# Patient Record
Sex: Male | Born: 1980
Health system: Southern US, Community
[De-identification: ages and names within clinical notes are randomized; demographics above are authoritative.]

## PROBLEM LIST (undated history)

## (undated) DIAGNOSIS — F419 Anxiety disorder, unspecified: Secondary | ICD-10-CM

## (undated) HISTORY — DX: Anxiety disorder, unspecified: F41.9

---

## 2001-07-09 HISTORY — PX: TONSILLECTOMY: SUR1361

## 2015-05-12 ENCOUNTER — Other Ambulatory Visit: Payer: Self-pay | Admitting: *Deleted

## 2015-05-12 ENCOUNTER — Ambulatory Visit
Admission: RE | Admit: 2015-05-12 | Discharge: 2015-05-12 | Disposition: A | Discharge status: Home / Self Care | Payer: No Typology Code available for payment source | Source: Ambulatory Visit | Attending: *Deleted | Admitting: *Deleted

## 2015-05-12 ENCOUNTER — Encounter (INDEPENDENT_AMBULATORY_CARE_PROVIDER_SITE_OTHER): Payer: Self-pay

## 2015-05-12 DIAGNOSIS — R7611 Nonspecific reaction to tuberculin skin test without active tuberculosis: Secondary | ICD-10-CM

## 2016-04-04 ENCOUNTER — Other Ambulatory Visit: Payer: Self-pay | Admitting: *Deleted

## 2016-04-04 ENCOUNTER — Ambulatory Visit
Admission: RE | Admit: 2016-04-04 | Discharge: 2016-04-04 | Disposition: A | Discharge status: Home / Self Care | Payer: No Typology Code available for payment source | Source: Ambulatory Visit | Attending: *Deleted | Admitting: *Deleted

## 2016-04-04 DIAGNOSIS — R7611 Nonspecific reaction to tuberculin skin test without active tuberculosis: Secondary | ICD-10-CM

## 2016-06-28 ENCOUNTER — Ambulatory Visit (INDEPENDENT_AMBULATORY_CARE_PROVIDER_SITE_OTHER): Payer: BLUE CROSS/BLUE SHIELD | Admitting: Physician Assistant

## 2016-06-28 VITALS — BP 118/80 | HR 89 | Temp 97.3°F | Resp 20 | Ht 72.5 in | Wt 220.8 lb

## 2016-06-28 DIAGNOSIS — J988 Other specified respiratory disorders: Secondary | ICD-10-CM

## 2016-06-28 MED ORDER — FLUTICASONE PROPIONATE 50 MCG/ACT NA SUSP: 2.0000 | Freq: Every day | NASAL | 12 refills | Status: DC

## 2016-06-28 MED ORDER — AMOXICILLIN-POT CLAVULANATE 875-125 MG PO TABS: 1.0000 | ORAL_TABLET | Freq: Two times a day (BID) | ORAL | 0 refills | Status: AC

## 2016-06-28 MED ORDER — BENZONATATE 100 MG PO CAPS: 100.0000 mg | ORAL_CAPSULE | Freq: Three times a day (TID) | ORAL | 0 refills | Status: DC | PRN

## 2016-06-28 NOTE — Progress Notes (Signed)
[facility] [address], [place] [address] [phone]  Date:  06/28/2016   Name:  Jeff Woods   DOB:  [birth year]   MRN:  [id number]  PCP:  No PCP Per Patient    History of Present Illness:  Jeff Woods is a 35 y.o. male patient who [facility] to [facility] for cc of nasal congestion and cough. --sinus pressure for --A few days. Malaise and fatigue present. --Nasal congestion and cough. The cough appes productive though only more prominent in the morning. --No shortness of breath or trouble breathing.   There are no active problems to display for this patient.   Past Medical History:  Diagnosis Date  . Anxiety     No past surgical history on file.  Social History  Substance Use Topics  . Smoking status: Current Every Day Smoker  . Smokeless tobacco: Not on file  . Alcohol use Not on file    No family history on file.  No Known Allergies  Medication list has been reviewed and updated.  No current outpatient prescriptions on file prior to visit.   No current facility-administered medications on file prior to visit.     ROS ROS otherwise unremarkable unless listed above.  Physical Examination: BP 118/80   Pulse 89   Temp 97.3 F (36.3 C) (Oral)   Resp 20   Ht 6' 0.5" (1.842 m)   Wt 220 lb 12.8 oz (100.2 kg)   SpO2 96%   BMI 29.53 kg/m  Ideal Body Weight: Weight in (lb) to have BMI = 25: 186.5  Physical Exam  Constitutional: He is oriented to person, place, and time. He appears well-developed and well-nourished. No distress.  HENT:  Head: Atraumatic.  Right Ear: Tympanic membrane, external ear and ear canal normal.  Left Ear: Tympanic membrane, external ear and ear canal normal.  Nose: Mucosal edema and rhinorrhea present. Right sinus exhibits no maxillary sinus tenderness and no frontal sinus tenderness. Left sinus exhibits no maxillary sinus tenderness and no frontal sinus tenderness.  Mouth/Throat: No uvula swelling. No  oropharyngeal exudate, posterior oropharyngeal edema or posterior oropharyngeal erythema.  Eyes: Conjunctivae, EOM and lids are normal. Pupils are equal, round, and reactive to light. Right eye exhibits normal extraocular motion. Left eye exhibits normal extraocular motion.  Neck: Trachea normal and full passive range of motion without pain. No edema and no erythema present.  Cardiovascular: Normal rate.   Pulmonary/Chest: Effort normal. No respiratory distress. He has no decreased breath sounds. He has no wheezes. He has no rhonchi.  Neurological: He is alert and oriented to person, place, and time.  Skin: Skin is warm and dry. He is not diaphoretic.  Psychiatric: He has a normal mood and affect. His behavior is normal.     Assessment and Plan: Jeff Woods is a 35 y.o. male who is here today for nasal congestion and cough. Will cover for bacterial etiology. Respiratory infection - Plan: benzonatate (TESSALON) 100 MG capsule, amoxicillin-clavulanate (AUGMENTIN) 875-125 MG tablet, fluticasone (FLONASE) 50 MCG/ACT nasal spray   Trena PlattStephanie Llesenia Fogal, PA-C [facility] 06/28/2016 2:44 PM

## 2016-06-28 NOTE — Patient Instructions (Addendum)
   Please hydrate well with water, 64 oz or more You can use the delsym over the counter for daytime cough.  You can also use the tessalon pearls that were prescribed. Take antibiotic as prescribed.   IF you received an x-ray today, you will receive an invoice from [facility]. Please contact [facility] at [phone] with questions or concerns regarding your invoice.   IF you received labwork today, you will receive an invoice from [facility]. Please contact [facility] at [phone] with questions or concerns regarding your invoice.   Our billing staff will not be able to assist you with questions regarding bills from these companies.  You will be contacted with the lab results as soon as they are available. The fastest way to get your results is to activate your My Chart account. Instructions are located on the last page of this paperwork. If you have not heard from us regarding the results in 2 weeks, please contact this office.

## 2017-04-10 ENCOUNTER — Ambulatory Visit: Payer: Self-pay | Admitting: Internal Medicine

## 2017-05-10 ENCOUNTER — Ambulatory Visit: Payer: Self-pay | Admitting: Internal Medicine

## 2018-04-18 ENCOUNTER — Encounter (INDEPENDENT_AMBULATORY_CARE_PROVIDER_SITE_OTHER): Payer: Self-pay

## 2018-04-18 ENCOUNTER — Encounter: Payer: Self-pay | Admitting: Family

## 2018-04-18 ENCOUNTER — Other Ambulatory Visit (INDEPENDENT_AMBULATORY_CARE_PROVIDER_SITE_OTHER): Payer: BLUE CROSS/BLUE SHIELD

## 2018-04-18 ENCOUNTER — Other Ambulatory Visit: Payer: BLUE CROSS/BLUE SHIELD

## 2018-04-18 ENCOUNTER — Ambulatory Visit (INDEPENDENT_AMBULATORY_CARE_PROVIDER_SITE_OTHER): Payer: BLUE CROSS/BLUE SHIELD | Admitting: Family

## 2018-04-18 VITALS — BP 106/84 | HR 91 | Temp 98.1°F | Ht 72.5 in | Wt 213.0 lb

## 2018-04-18 DIAGNOSIS — Z13220 Encounter for screening for lipoid disorders: Secondary | ICD-10-CM | POA: Diagnosis not present

## 2018-04-18 DIAGNOSIS — J342 Deviated nasal septum: Secondary | ICD-10-CM

## 2018-04-18 DIAGNOSIS — F1021 Alcohol dependence, in remission: Secondary | ICD-10-CM

## 2018-04-18 DIAGNOSIS — F419 Anxiety disorder, unspecified: Secondary | ICD-10-CM | POA: Diagnosis not present

## 2018-04-18 DIAGNOSIS — F1921 Other psychoactive substance dependence, in remission: Secondary | ICD-10-CM | POA: Insufficient documentation

## 2018-04-18 DIAGNOSIS — R5383 Other fatigue: Secondary | ICD-10-CM

## 2018-04-18 DIAGNOSIS — Z0000 Encounter for general adult medical examination without abnormal findings: Secondary | ICD-10-CM

## 2018-04-18 LAB — COMPREHENSIVE METABOLIC PANEL
ALBUMIN: 4.7 g/dL (ref 3.5–5.2)
ALT: 30 U/L (ref 0–53)
AST: 17 U/L (ref 0–37)
Alkaline Phosphatase: 66 U/L (ref 39–117)
BUN: 16 mg/dL (ref 6–23)
CHLORIDE: 105 meq/L (ref 96–112)
CO2: 30 meq/L (ref 19–32)
Calcium: 9.2 mg/dL (ref 8.4–10.5)
Creatinine, Ser: 1.01 mg/dL (ref 0.40–1.50)
GFR: 87.81 mL/min (ref >60.00)
Glucose, Bld: 99 mg/dL (ref 70–99)
Potassium: 4.1 mEq/L (ref 3.5–5.1)
SODIUM: 142 meq/L (ref 135–145)
Total Bilirubin: 0.4 mg/dL (ref 0.2–1.2)
Total Protein: 7.1 g/dL (ref 6.0–8.3)

## 2018-04-18 LAB — CBC WITH DIFFERENTIAL/PLATELET
BASOS PCT: 0.4 % (ref 0.0–3.0)
Basophils Absolute: 0 10*3/uL (ref 0.0–0.1)
Eosinophils Absolute: 0.1 10*3/uL (ref 0.0–0.7)
Eosinophils Relative: 1.7 % (ref 0.0–5.0)
HCT: 43.9 % (ref 39.0–52.0)
Hemoglobin: 15 g/dL (ref 13.0–17.0)
Lymphocytes Relative: 38.5 % (ref 12.0–46.0)
Lymphs Abs: 1.8 10*3/uL (ref 0.7–4.0)
MCHC: 34.1 g/dL (ref 30.0–36.0)
MCV: 93.7 fl (ref 78.0–100.0)
MONO ABS: 0.3 10*3/uL (ref 0.1–1.0)
Monocytes Relative: 6.5 % (ref 3.0–12.0)
NEUTROS ABS: 2.5 10*3/uL (ref 1.4–7.7)
NEUTROS PCT: 52.9 % (ref 43.0–77.0)
PLATELETS: 182 10*3/uL (ref 150.0–400.0)
RBC: 4.69 Mil/uL (ref 4.22–5.81)
RDW: 12.5 % (ref 11.5–15.5)
WBC: 4.7 10*3/uL (ref 4.0–10.5)

## 2018-04-18 LAB — LIPID PANEL
Cholesterol: 247 mg/dL — ABNORMAL HIGH (ref 0–200)
HDL: 39.9 mg/dL (ref >39.00)
TRIGLYCERIDES: 407 mg/dL — AB (ref 0.0–149.0)
Total CHOL/HDL Ratio: 6

## 2018-04-18 LAB — LDL CHOLESTEROL, DIRECT: LDL DIRECT: 133 mg/dL

## 2018-04-18 LAB — TSH: TSH: 1.91 u[IU]/mL (ref 0.35–4.50)

## 2018-04-18 MED ORDER — FLUTICASONE PROPIONATE 50 MCG/ACT NA SUSP
2.0000 | Freq: Every day | NASAL | 6 refills | Status: DC
Start: 2018-04-18 — End: 2018-04-25

## 2018-04-18 MED ORDER — QUETIAPINE FUMARATE 100 MG PO TABS: 50.0000 mg | ORAL_TABLET | Freq: Every day | ORAL | Status: AC

## 2018-04-18 MED ORDER — FLUTICASONE PROPIONATE 50 MCG/ACT NA SUSP: 2.0000 | Freq: Every day | NASAL | 6 refills | Status: AC

## 2018-04-18 NOTE — Progress Notes (Signed)
Jeff Woods is a 37 y.o. male with the following history as recorded in EpicCare:  Patient Active Problem List   Diagnosis Date Noted  . Anxiety 04/18/2018  . History of drug dependence ([place]) 04/18/2018    Current Outpatient Medications  Medication Sig Dispense Refill  . QUEtiapine (SEROQUEL) 100 MG tablet Take 0.5 tablets (50 mg total) by mouth at bedtime.    Marland Kitchen venlafaxine XR (EFFEXOR-XR) 150 MG 24 hr capsule Take 150 mg by mouth daily with breakfast.    . fluticasone (FLONASE) 50 MCG/ACT nasal spray Place 2 sprays into both nostrils daily. 16 g 6  . fluticasone (FLONASE) 50 MCG/ACT nasal spray Place 2 sprays into both nostrils daily. 16 g 6   No current facility-administered medications for this visit.     Allergies: Patient has no known allergies.  Past Medical History:  Diagnosis Date  . Anxiety     Past Surgical History:  Procedure Laterality Date  . TONSILLECTOMY  2003    Family History  Problem Relation Age of Onset  . Alcohol abuse Mother   . Osteoarthritis Mother   . Depression Mother   . Prostate cancer Father   . Alcohol abuse Maternal Grandfather   . Depression Maternal Grandfather   . CAD Maternal Grandfather   . CAD Paternal Grandfather   . Hypertension Paternal Grandfather   . Hyperlipidemia Paternal Grandfather     Social History   Tobacco Use  . Smoking status: Current Every Day Smoker  Substance Use Topics  . Alcohol use: Not on file    Subjective:  Patient presents today as a new patient; would like to get CPE updated today; has been treated for alcohol/ drug abuse in the past- sober x 2 years; "re-building his life"; trying to get back up to date on basic healthcare needs;  Needs referral to ENT for deviated septum;  Defers updating vaccines today; No concerns regarding vision; overdue to see dentist;  Review of Systems  Constitutional: Negative for weight loss.  HENT: Positive for congestion and sinus pain. Negative for hearing loss.    Eyes: Negative for blurred vision and pain.  Respiratory: Negative for shortness of breath.   Cardiovascular: Negative for chest pain and palpitations.  Gastrointestinal: Negative for abdominal pain, constipation and diarrhea.  Genitourinary: Negative for dysuria and hematuria.  Musculoskeletal: Negative for myalgias.  Neurological: Negative for dizziness, weakness and headaches.  Psychiatric/Behavioral: Negative for depression and substance abuse. The patient is nervous/anxious. The patient does not have insomnia.       Objective:  Vitals:   04/18/18 1103  BP: 106/84  Pulse: 91  Temp: 98.1 F (36.7 C)  TempSrc: Oral  SpO2: 94%  Weight: 213 lb 0.6 oz (96.6 kg)  Height: 6' 0.5" (1.842 m)    General: Well developed, well nourished, in no acute distress  Skin : Warm and dry.  Head: Normocephalic and atraumatic  Eyes: Sclera and conjunctiva clear; pupils round and reactive to light; extraocular movements intact  Ears: External normal; canals clear; tympanic membranes normal  Oropharynx: Pink, supple. No suspicious lesions  Neck: Supple without thyromegaly, adenopathy  Lungs: Respirations unlabored; clear to auscultation bilaterally without wheeze, rales, rhonchi  CVS exam: normal rate and regular rhythm.  Abdomen: Soft; nontender; nondistended; normoactive bowel sounds; no masses or hepatosplenomegaly  Musculoskeletal: No deformities; no active joint inflammation  Extremities: No edema, cyanosis, clubbing  Vessels: Symmetric bilaterally  Neurologic: Alert and oriented; speech intact; face symmetrical; moves all extremities well; CNII-XII intact without  focal deficit   Assessment:  1. PE (physical exam), annual   2. Anxiety   3. History of alcoholism ([place])   4. Deviated septum   5. Lipid screening   6. Other fatigue     Plan:  Age appropriate preventive healthcare needs addressed; encouraged regular eye doctor and dental exams; encouraged regular exercise; will update  labs and refills as needed today; follow-up to be determined; Patient defers updating vaccines today/ defers STD screen- in stable relationship; Rx for Flonase updated;    Return for Dr. Raeford Razor for next Friday-4th finger right hand.  Orders Placed This Encounter  Procedures  . CBC w/Diff    Standing Status:   Future    Number of Occurrences:   1    Standing Expiration Date:   04/18/2019  . Comp Met (CMET)    Standing Status:   Future    Number of Occurrences:   1    Standing Expiration Date:   04/18/2019  . Lipid panel    Standing Status:   Future    Number of Occurrences:   1    Standing Expiration Date:   04/19/2019  . TSH    Standing Status:   Future    Number of Occurrences:   1    Standing Expiration Date:   04/18/2019  . Ambulatory referral to ENT    Referral Priority:   Routine    Referral Type:   Consultation    Referral Reason:   Specialty Services Required    Requested Specialty:   Otolaryngology    Number of Visits Requested:   1    Requested Prescriptions   Signed Prescriptions Disp Refills  . QUEtiapine (SEROQUEL) 100 MG tablet      Sig: Take 0.5 tablets (50 mg total) by mouth at bedtime.  . fluticasone (FLONASE) 50 MCG/ACT nasal spray 16 g 6    Sig: Place 2 sprays into both nostrils daily.  . fluticasone (FLONASE) 50 MCG/ACT nasal spray 16 g 6    Sig: Place 2 sprays into both nostrils daily.

## 2018-04-18 NOTE — Patient Instructions (Signed)

## 2018-04-24 NOTE — Progress Notes (Signed)
Jeff Woods - 37 y.o. male MRN [id number]  Date of birth: 1980/12/18  SUBJECTIVE:  Including CC & ROS.  No chief complaint on file.   Jeff Woods is a 37 y.o. male that is presenting with pain and lesion on the dorsal aspect of his PIP on the ring finger on his right hand.  He is noticed the bump in large of the past few weeks.  He notices this when he is playing golf.  The pain is worse after having been rubbed.  It is localized to the joint.  He denies any inciting event.  Has not tried any modalities today.  Pain is intermittent.  No redness or swelling.   Review of Systems  Constitutional: Negative for fever.  HENT: Negative for congestion.   Respiratory: Negative for cough.   Cardiovascular: Negative for chest pain.  Gastrointestinal: Negative for abdominal pain.  Musculoskeletal: Negative for gait problem.  Skin: Negative for color change.  Neurological: Negative for weakness.  Hematological: Negative for adenopathy.  Psychiatric/Behavioral: Negative for agitation.    HISTORY: Past Medical, Surgical, Social, and Family History Reviewed & Updated per EMR.   Pertinent Historical Findings include:  Past Medical History:  Diagnosis Date  . Anxiety     Past Surgical History:  Procedure Laterality Date  . TONSILLECTOMY  2003    No Known Allergies  Family History  Problem Relation Age of Onset  . Alcohol abuse Mother   . Osteoarthritis Mother   . Depression Mother   . Prostate cancer Father   . Alcohol abuse Maternal Grandfather   . Depression Maternal Grandfather   . CAD Maternal Grandfather   . CAD Paternal Grandfather   . Hypertension Paternal Grandfather   . Hyperlipidemia Paternal Grandfather      Social History   Socioeconomic History  . Marital status: Single    Spouse name: Not on file  . Number of children: Not on file  . Years of education: Not on file  . Highest education level: Not on file  Occupational History  . Not on file  Social Needs    . Financial resource strain: Not on file  . Food insecurity:    Worry: Not on file    Inability: Not on file  . Transportation needs:    Medical: Not on file    Non-medical: Not on file  Tobacco Use  . Smoking status: Current Every Day Smoker  . Smokeless tobacco: Never Used  Substance and Sexual Activity  . Alcohol use: Not Currently  . Drug use: Not Currently  . Sexual activity: Yes    Partners: Female  Lifestyle  . Physical activity:    Days per week: Not on file    Minutes per session: Not on file  . Stress: Not on file  Relationships  . Social connections:    Talks on phone: Not on file    Gets together: Not on file    Attends religious service: Not on file    Active member of club or organization: Not on file    Attends meetings of clubs or organizations: Not on file    Relationship status: Not on file  . Intimate partner violence:    Fear of current or ex partner: Not on file    Emotionally abused: Not on file    Physically abused: Not on file    Forced sexual activity: Not on file  Other Topics Concern  . Not on file  Social History Narrative  . Not  on file     PHYSICAL EXAM:  VS: BP 124/84   Pulse 85   Temp (!) 97.5 F (36.4 C) (Oral)   Resp 16   Wt 217 lb (98.4 kg)   SpO2 97%   BMI 29.03 kg/m  Physical Exam Gen: NAD, alert, cooperative with exam, well-appearing ENT: normal lips, normal nasal mucosa,  Eye: normal EOM, normal conjunctiva and lids CV:  no edema, +2 pedal pulses   Resp: no accessory muscle use, non-labored,  Skin: no rashes, no areas of induration  Neuro: normal tone, normal sensation to touch Psych:  normal insight, alert and oriented MSK:  Right hand:  Ring finger with cystic-like structure occurring PIP joint of the ring finger. No overlying redness or discoloration. Normal finger range of motion. Mild tenderness to palpation over this area  Neurovascularly intact  Limited ultrasound: right ring finger:  Normal-appearing  joint space. No effusion. Normal-appearing extensor tendons. Normal-appearing bone cortex. It appears to be a cystic structure when finger is in flexion there is appreciated on the ulnar aspect of the PIP joint  Summary: Possible cyst of the capsule of the PIP joint.  Ultrasound and interpretation by Clare Gandy, MD        ASSESSMENT & PLAN:   Pain of finger of right hand Is possible that he had a trauma sometime ago and this cyst developed.  It only appears in flexion.  Possibly associated with the capsule of the PIP joint. -X-ray today. -If it does not appear to be calcified consider injection of this area.  Could consider MRI to better characterize.

## 2018-04-25 ENCOUNTER — Ambulatory Visit: Payer: BLUE CROSS/BLUE SHIELD | Admitting: Family Medicine

## 2018-04-25 ENCOUNTER — Ambulatory Visit (INDEPENDENT_AMBULATORY_CARE_PROVIDER_SITE_OTHER)
Admission: RE | Admit: 2018-04-25 | Discharge: 2018-04-25 | Disposition: A | Discharge status: Home / Self Care | Payer: BLUE CROSS/BLUE SHIELD | Source: Ambulatory Visit | Attending: Family Medicine | Admitting: Family Medicine

## 2018-04-25 ENCOUNTER — Encounter: Payer: Self-pay | Admitting: Family Medicine

## 2018-04-25 VITALS — BP 124/84 | HR 85 | Temp 97.5°F | Resp 16 | Wt 217.0 lb

## 2018-04-25 DIAGNOSIS — M79644 Pain in right finger(s): Secondary | ICD-10-CM

## 2018-04-25 NOTE — Patient Instructions (Signed)
Nice to meet you  Please try to wrap the finger when you're playing  I will call you with the results from today.

## 2018-04-27 DIAGNOSIS — M79644 Pain in right finger(s): Secondary | ICD-10-CM | POA: Insufficient documentation

## 2018-04-27 NOTE — Assessment & Plan Note (Signed)
Is possible that he had a trauma sometime ago and this cyst developed.  It only appears in flexion.  Possibly associated with the capsule of the PIP joint. -X-ray today. -If it does not appear to be calcified consider injection of this area.  Could consider MRI to better characterize.

## 2018-04-28 ENCOUNTER — Telehealth: Payer: Self-pay | Admitting: Family Medicine

## 2018-04-28 NOTE — Telephone Encounter (Signed)
Left VM for patient. If he calls back please have him speak with a nurse/CMA and inform that his xray doesn't show a calcification. We could try injecting this area to see if this improves your knot. The [facility] can report results to patient.   If any questions then please take the best time and phone number to call and I will try to call him back.   Myra Rude, MD [facility] 04/28/2018, 3:08 PM

## 2018-04-29 ENCOUNTER — Telehealth: Payer: Self-pay | Admitting: Family

## 2018-04-29 NOTE — Telephone Encounter (Signed)
Dr. Jordan Likes spoke with patient, informed of results. Verbalized understanding.

## 2018-04-29 NOTE — Telephone Encounter (Signed)
Pt called in and results by Dr Jordan Likes read to patient. Pt verbalized understanding and would like to have the injection.  Please call him at [phone]. Best time is before 10am or 3-5 in the afternoon.  He is also available all day on Friday.

## 2019-02-08 IMAGING — DX DG FINGER RING 2+V*R*
3 series · 3 of 3 positions shown · non-contrast
Comparison: None.

CLINICAL DATA: Right fourth finger joint pain for 1 year. No
injury.

EXAM:
RIGHT RING FINGER 2+V

[finger ap]
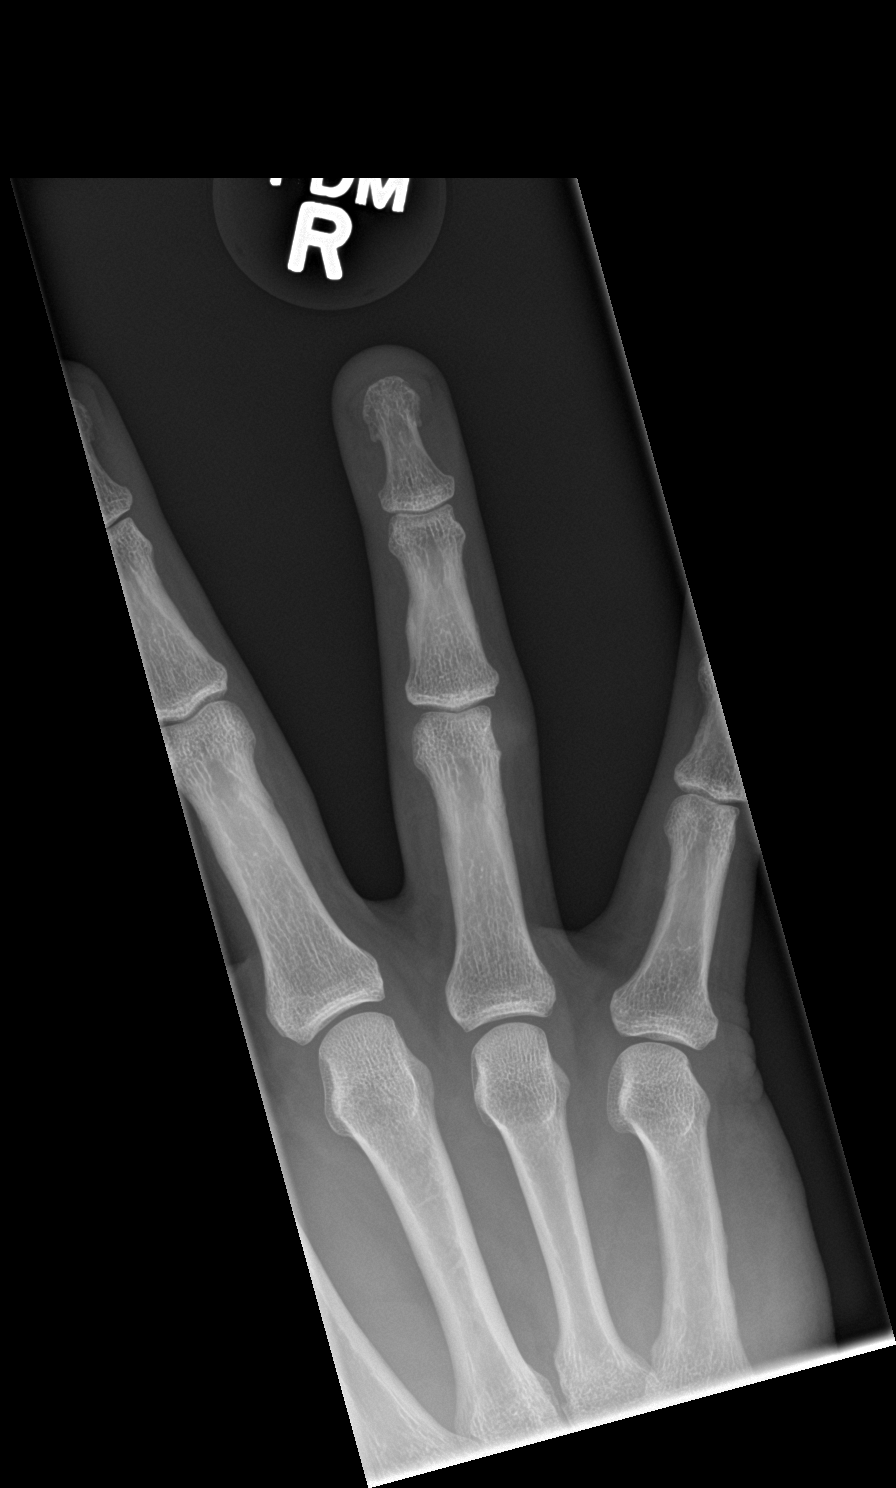

[finger obl]
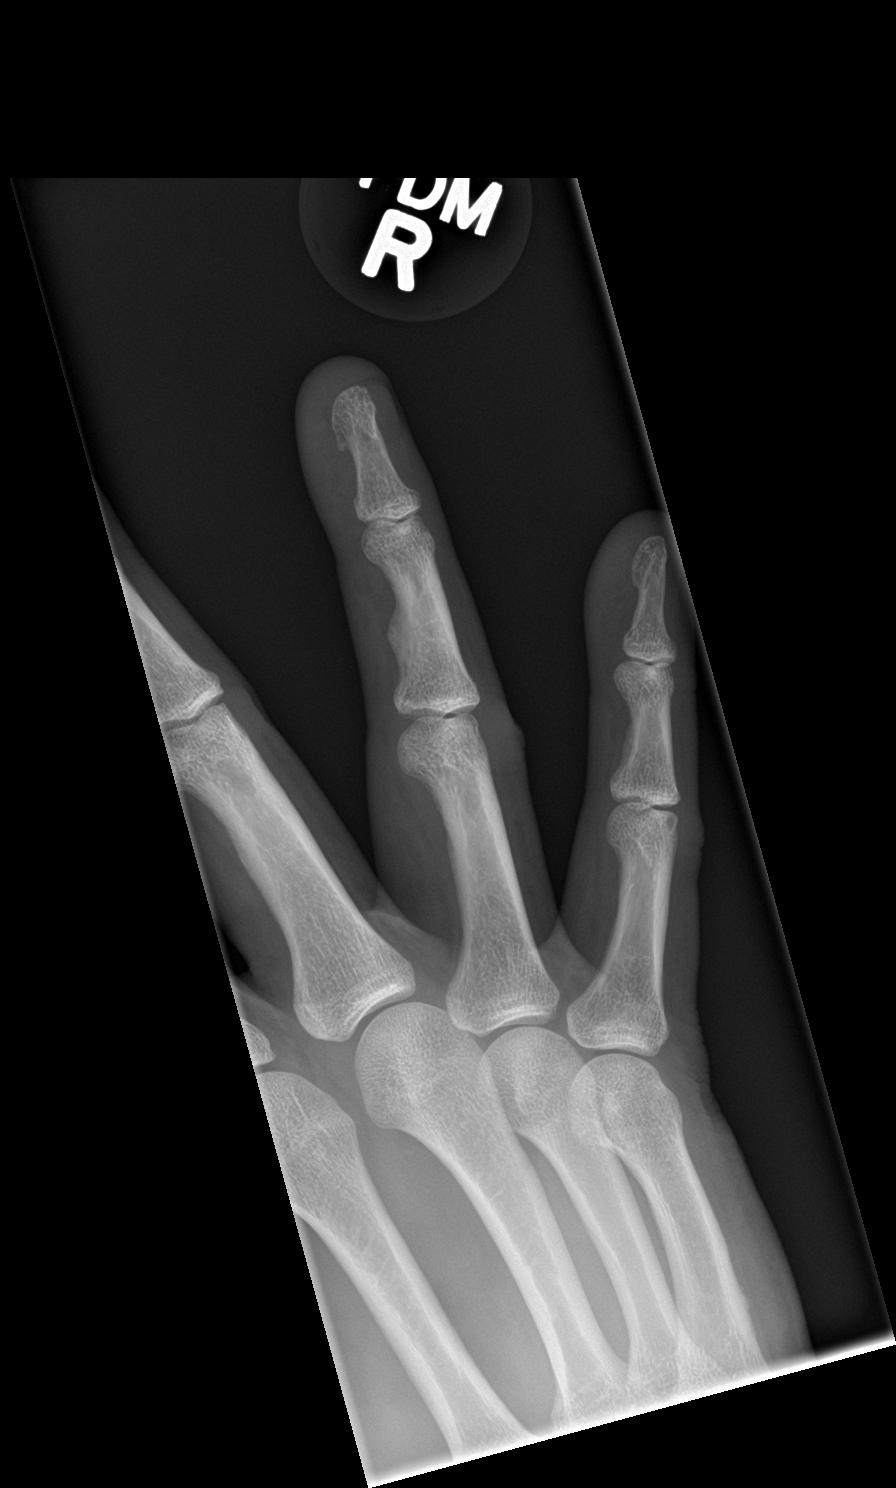

[finger lat]
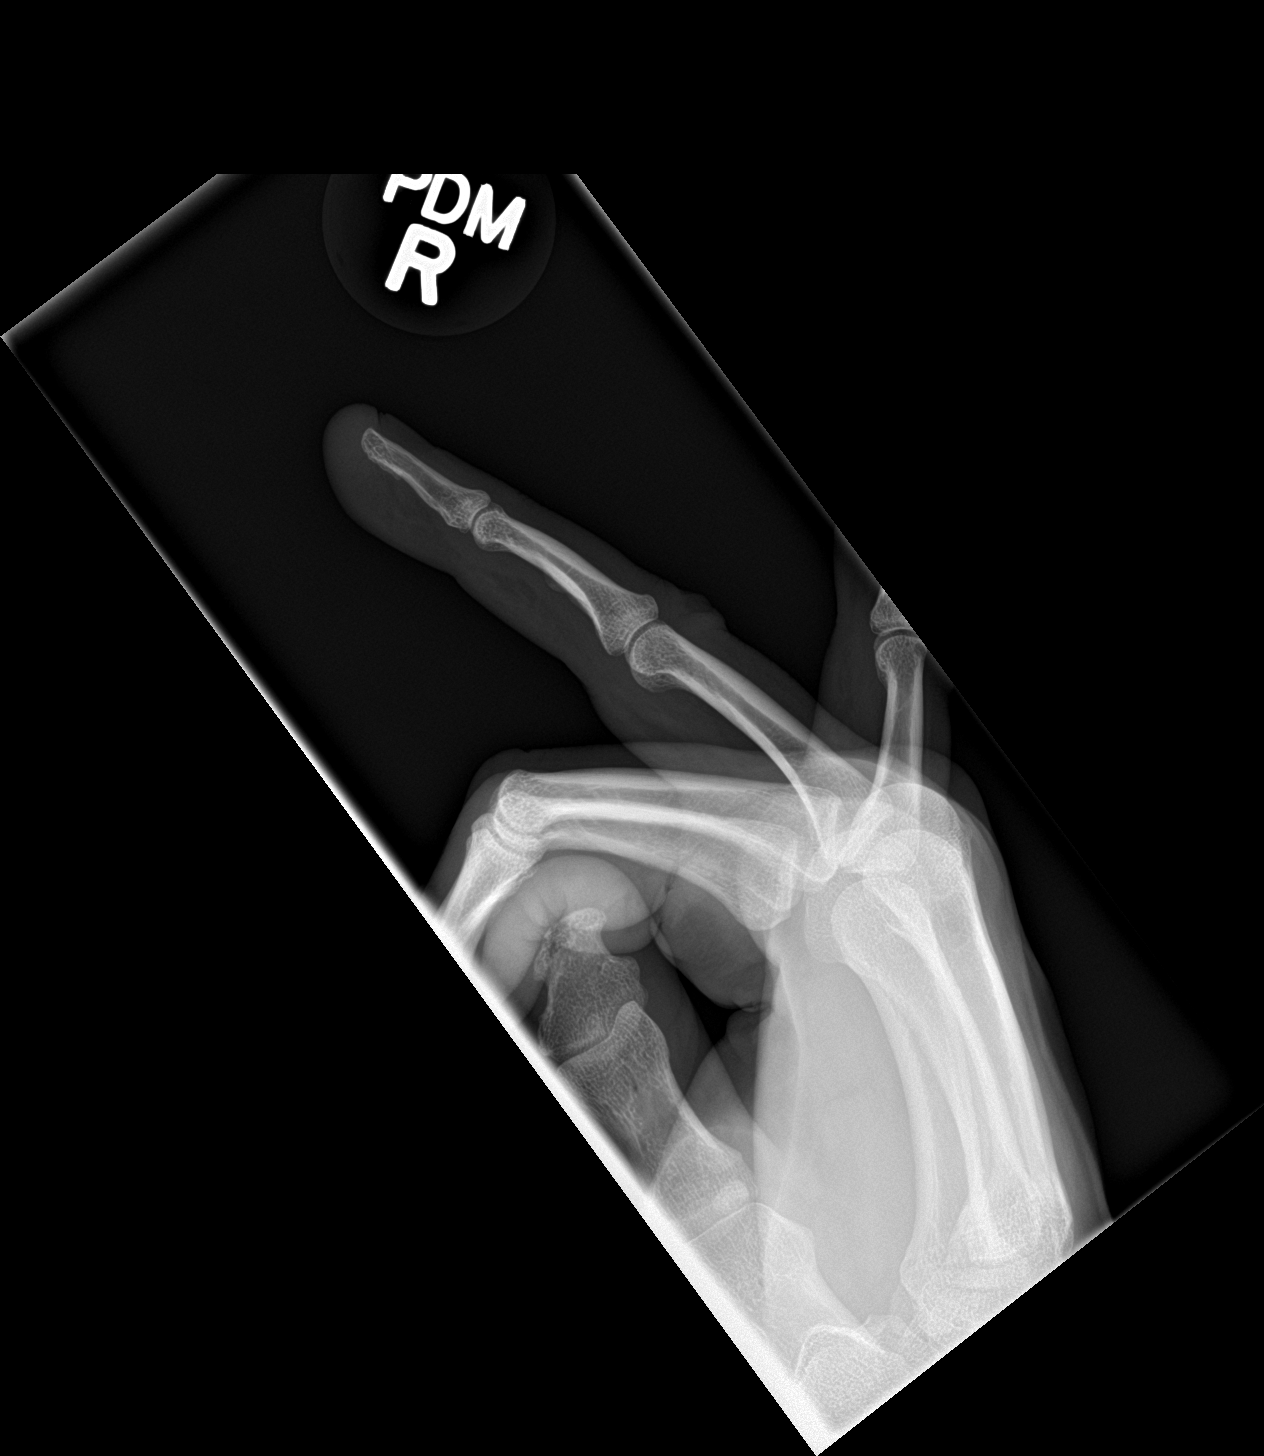

[3 of 3 positions shown; findings below may reference images not displayed]

FINDINGS: No acute osseous or joint abnormality. Difficult to exclude mild
erosive change along the lateral head of the fourth proximal
phalanx, with overlying soft tissue swelling.
IMPRESSION: 1. No acute findings.
2. Difficult to exclude slight chronic erosive change along the
lateral head of the fourth proximal phalanx, with overlying soft
tissue swelling.

## 2019-06-29 ENCOUNTER — Ambulatory Visit: Payer: BLUE CROSS/BLUE SHIELD | Attending: Internal Medicine

## 2019-06-29 DIAGNOSIS — Z20822 Contact with and (suspected) exposure to covid-19: Secondary | ICD-10-CM

## 2019-06-30 LAB — NOVEL CORONAVIRUS, NAA: SARS-CoV-2, NAA: NOT DETECTED
